# Patient Record
Sex: Female | Born: 1980 | Race: Black or African American | Hispanic: No | Marital: Single | Smoking: Former smoker
Health system: Southern US, Community
[De-identification: ages and names within clinical notes are randomized; demographics above are authoritative.]

## PROBLEM LIST (undated history)

## (undated) DIAGNOSIS — Z789 Other specified health status: Secondary | ICD-10-CM

## (undated) HISTORY — PX: WRIST FRACTURE SURGERY: SHX121

## (undated) HISTORY — PX: WRIST SURGERY: SHX841

---

## 2006-08-19 ENCOUNTER — Emergency Department (HOSPITAL_COMMUNITY): Admission: EM | Admit: 2006-08-19 | Discharge: 2006-08-19 | Payer: Self-pay | Admitting: Emergency Medicine

## 2006-11-22 ENCOUNTER — Emergency Department (HOSPITAL_COMMUNITY): Admission: EM | Admit: 2006-11-22 | Discharge: 2006-11-22 | Payer: Self-pay | Admitting: Emergency Medicine

## 2006-12-03 ENCOUNTER — Encounter: Admission: RE | Admit: 2006-12-03 | Discharge: 2006-12-03 | Payer: Self-pay | Admitting: Orthopedic Surgery

## 2006-12-11 ENCOUNTER — Ambulatory Visit (HOSPITAL_COMMUNITY): Admission: RE | Admit: 2006-12-11 | Discharge: 2006-12-11 | Payer: Self-pay | Admitting: Orthopedic Surgery

## 2007-01-21 ENCOUNTER — Encounter: Admission: RE | Admit: 2007-01-21 | Discharge: 2007-04-21 | Payer: Self-pay | Admitting: Orthopedic Surgery

## 2008-03-05 ENCOUNTER — Emergency Department (HOSPITAL_COMMUNITY): Admission: EM | Admit: 2008-03-05 | Discharge: 2008-03-05 | Payer: Self-pay | Admitting: Family Medicine

## 2008-06-26 IMAGING — CT CT EXTREM UP W/O CM*R*
3 series · 13 of 20 positions shown, 14 images · IV contrast (agent unspecified)
Comparison: Plain film examination 11/22/06.  No comparison CT.

CLINICAL DATA: Distal radial and ulnar fracture. 
CT OF THE RIGHT WRIST WITHOUT CONTRAST:
TECHNIQUE: Multidetector CT imaging was performed according to the standard protocol.  Multiplanar CT image reconstructions were also generated.

[Series 3: wrist/standard · axial · 0.33mm/px · z∈[-12,+39]mm · 8 of 105 slices shown]
[im 12/105  soft-tissue]
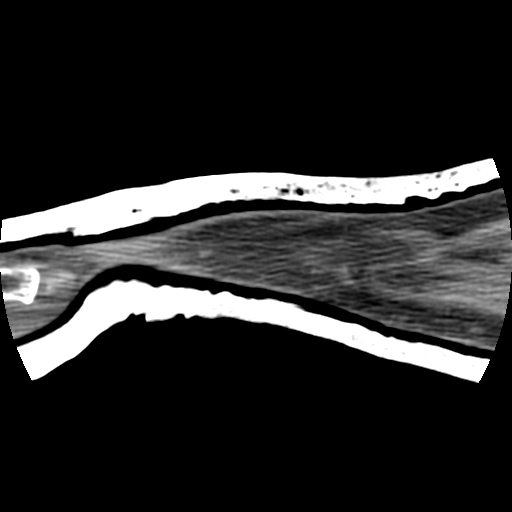
[im 24/105  soft-tissue]
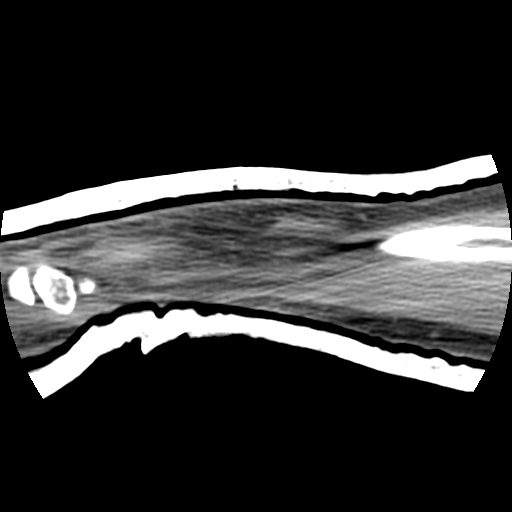
[im 35/105  soft-tissue]
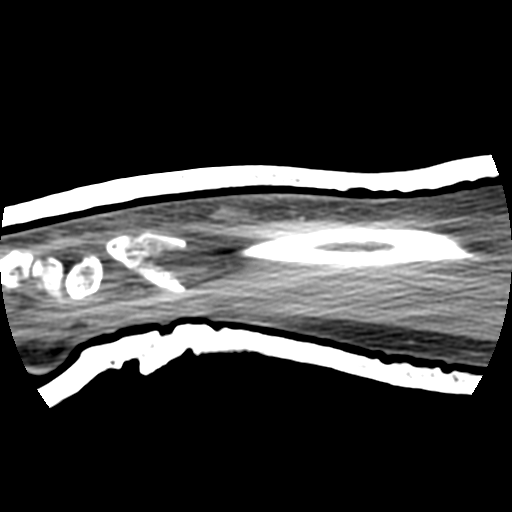
[im 47/105  soft-tissue]
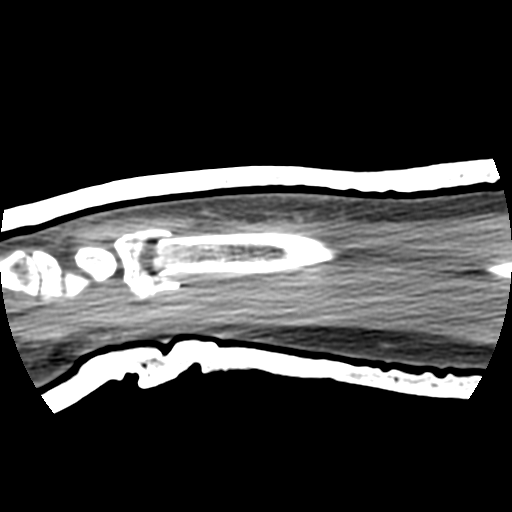
[im 58/105  soft-tissue]
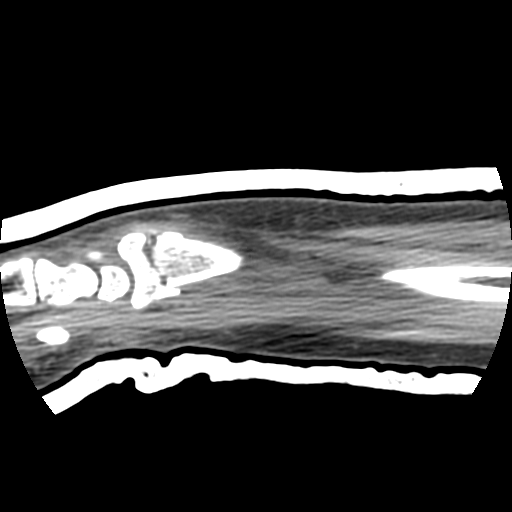
[im 70/105  soft-tissue]
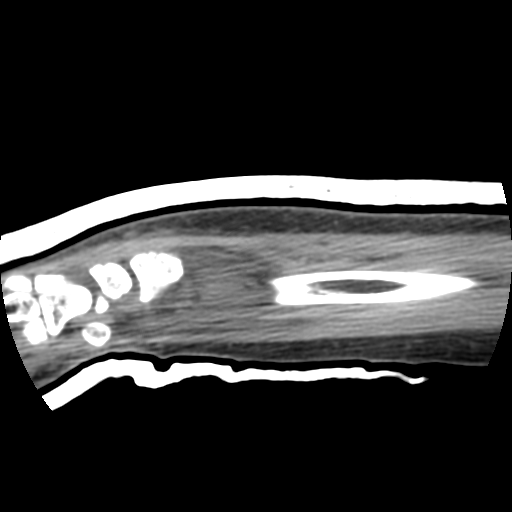
[im 81/105  soft-tissue]
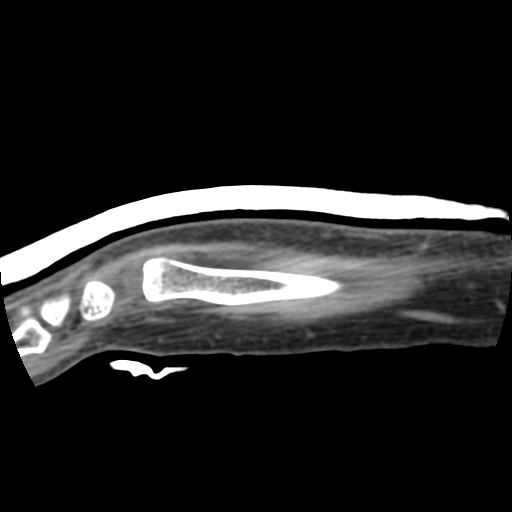
[im 93/105  soft-tissue]
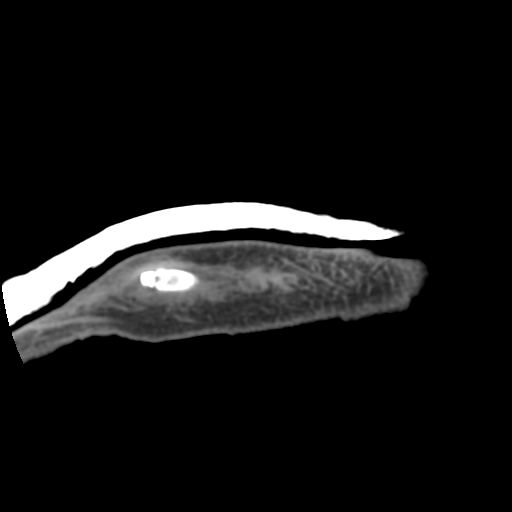

[Series 401: axial ref · sagittal · 0.33mm/px · 2 of 40 slices shown, 3 images]
[im 14/40  soft-tissue]
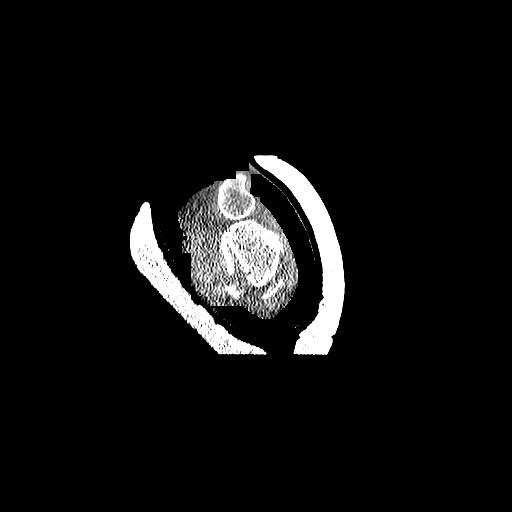
[im 14/40  bone]
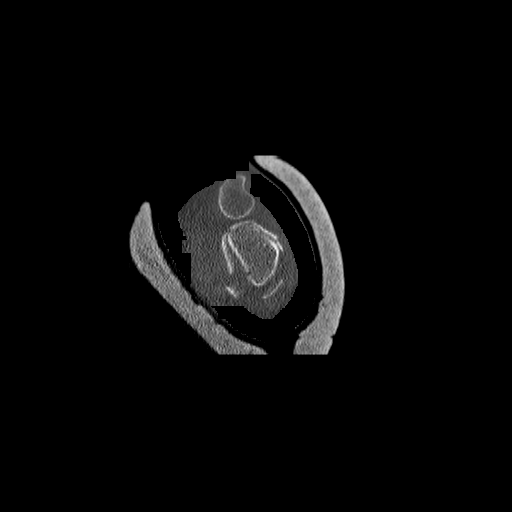
[im 27/40  bone]
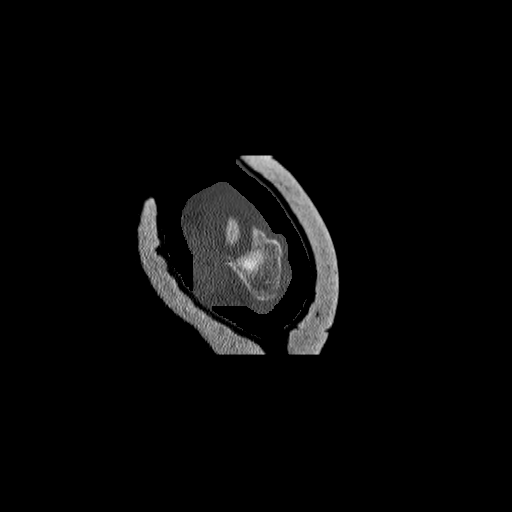

[Series 402: coronal ref · coronal · 0.33mm/px · 3 of 40 slices shown]
[im 8/40  bone]
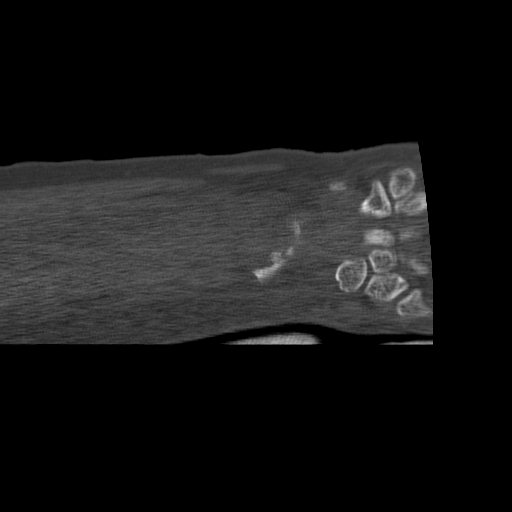
[im 16/40  bone]
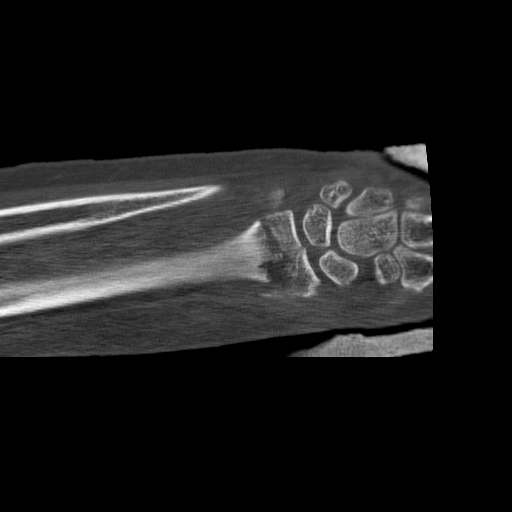
[im 24/40  bone]
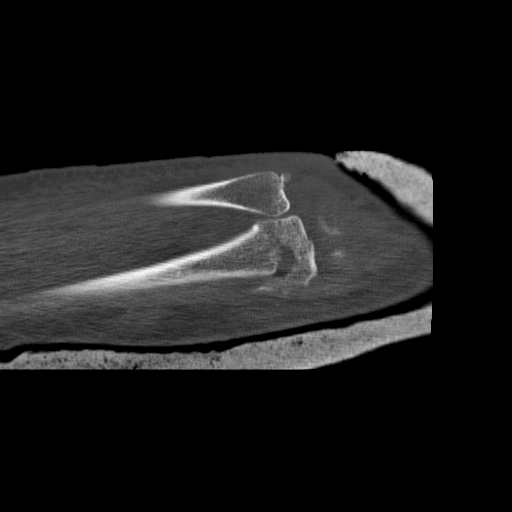

[13 of 20 positions shown; findings below may reference images not displayed]

FINDINGS: The present exam was performed with the patient in a plaster splint.  The distal [DATE] of the radius and ulna are included on the present exam.  There is a comminuted fracture of the distal radius with intraarticular extension.  Mild incongruity of the articular surface by 1 to 2 mm.  The comminuted distal radial fracture fragments are slightly separated and foreshortened overlying the distal radial shaft fracture aspect.  Ulnar styloid nondisplaced fracture.  Question tiny fracture of the palmar aspect of the trapezium (series 2 image 80).
IMPRESSION: 1.  Comminuted distal right radius fracture with intraarticular extension and alignment as described above.  Nondisplaced ulnar styloid fracture. 
2.  Question subtle fracture of the palmar aspect of the trapezium?

## 2008-07-04 IMAGING — CR DG WRIST 2V*R*
2 series · 2 of 2 positions shown · non-contrast
Comparison: 12/11/06.

CLINICAL DATA: Wrist ORIF. 
 PORTABLE RIGHT WRIST ? 2 VIEW:

[view not recorded (1 of 2)]
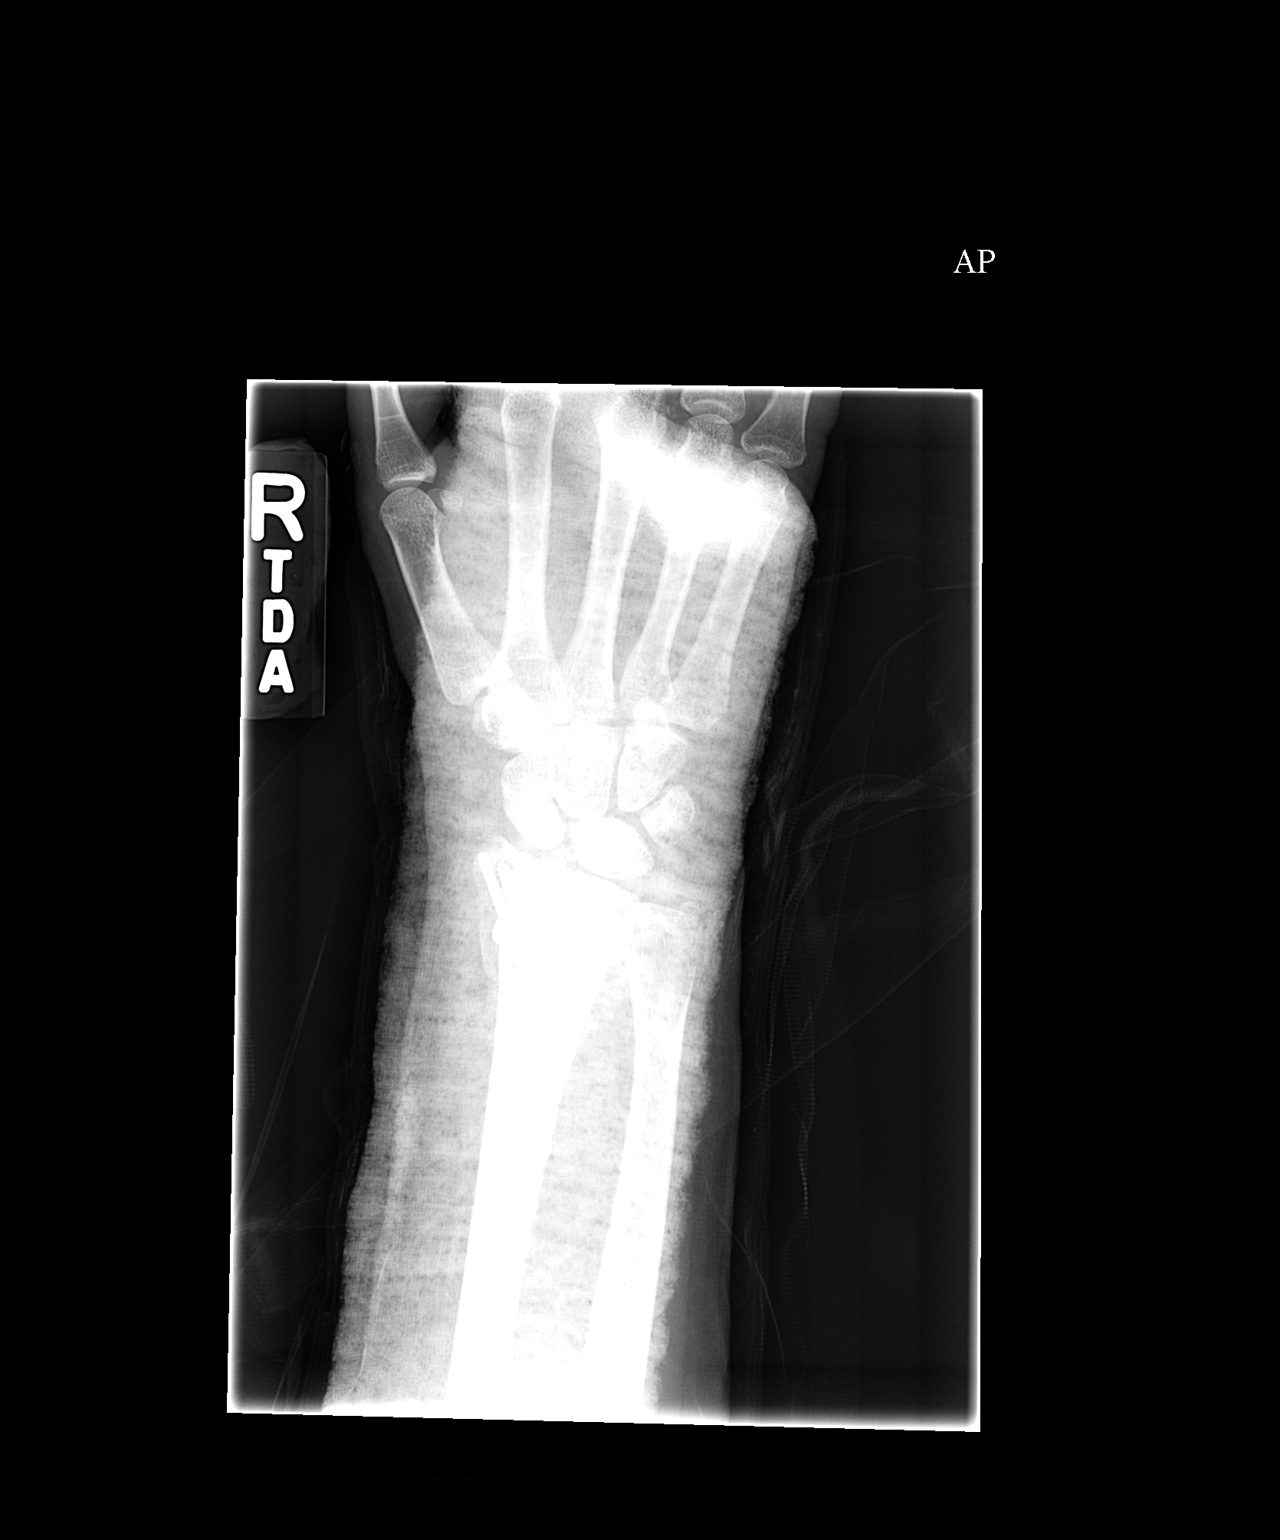

[view not recorded (2 of 2)]
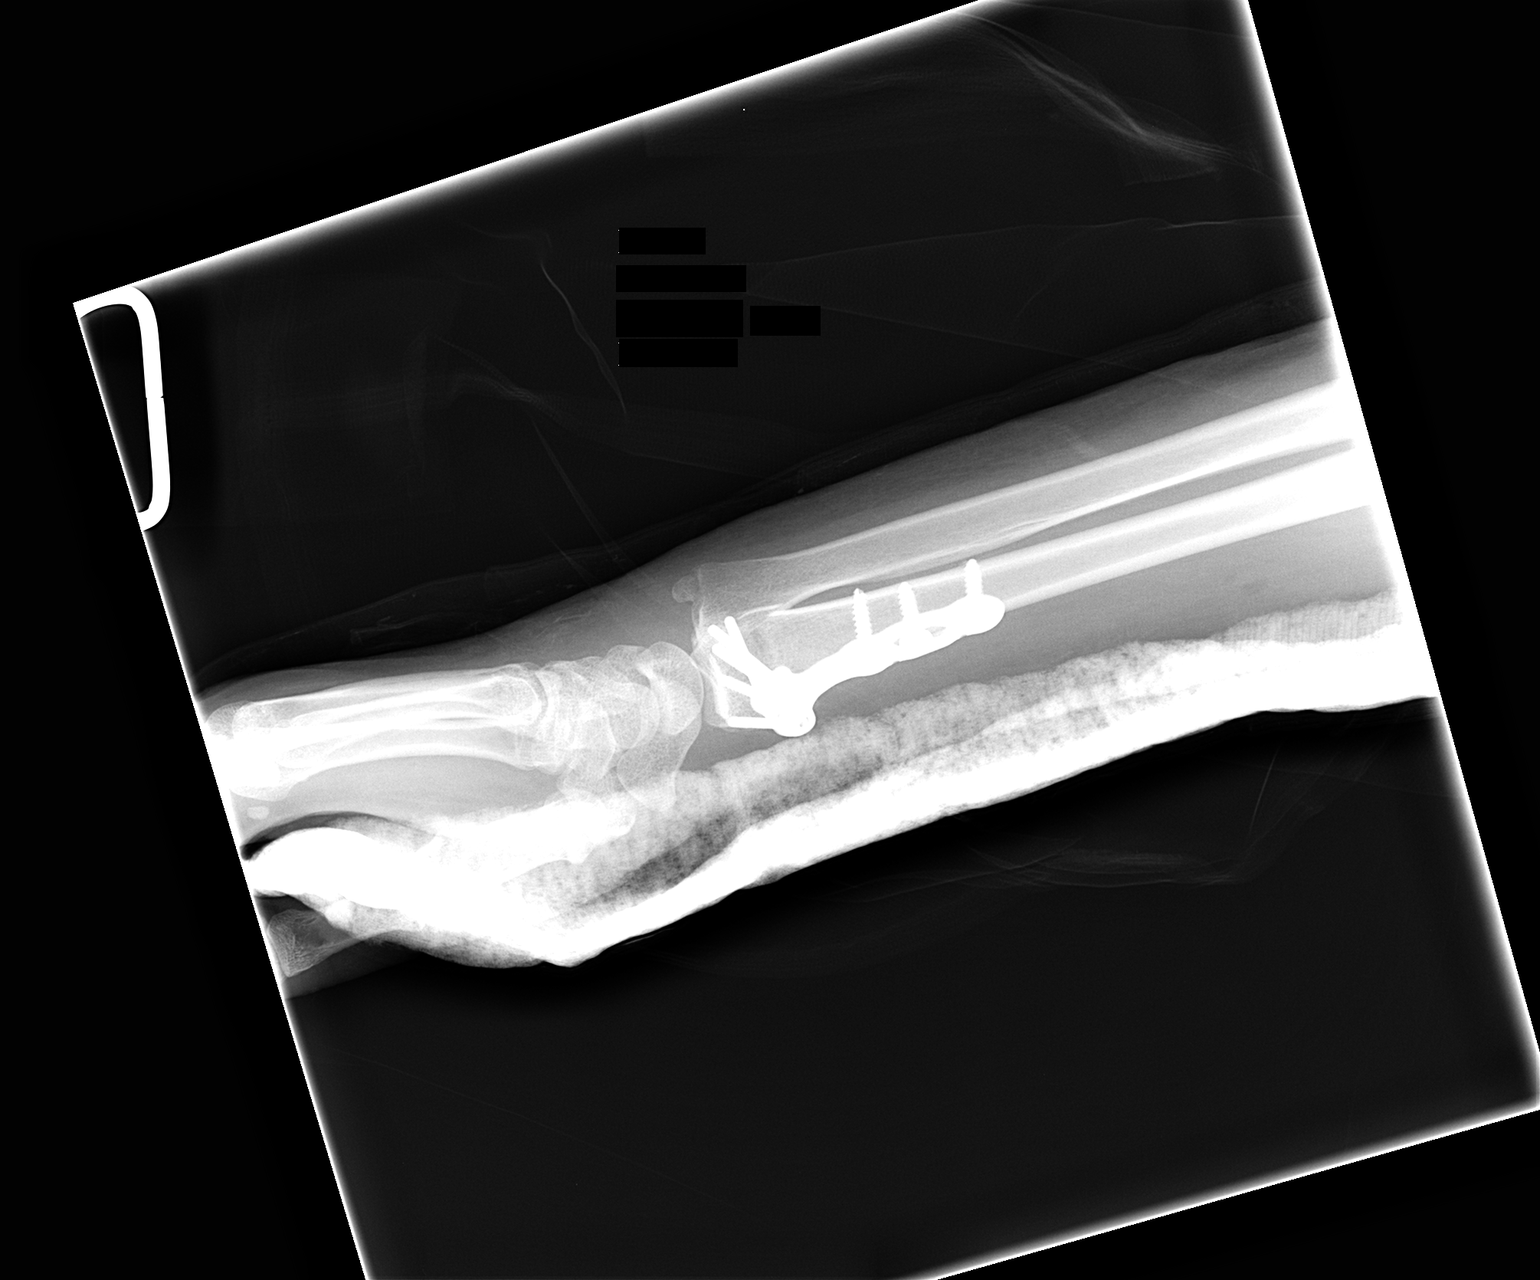

[2 of 2 positions shown; findings below may reference images not displayed]

FINDINGS: A plaster splint is in place.  There are plate and screws across the fracture of the distal radius.  Fracture is comminuted and mildly displaced but now in much better alignment than the original study.  There is a fracture of the ulnar styloid which has not been surgically repaired.
IMPRESSION: Volar plate and screw fusion of comminuted fracture of the distal radius now with satisfactory alignment.

## 2008-07-30 ENCOUNTER — Inpatient Hospital Stay (HOSPITAL_COMMUNITY): Admission: AD | Admit: 2008-07-30 | Discharge: 2008-07-30 | Payer: Self-pay | Admitting: Obstetrics

## 2008-09-03 ENCOUNTER — Inpatient Hospital Stay (HOSPITAL_COMMUNITY): Admission: AD | Admit: 2008-09-03 | Discharge: 2008-09-05 | Payer: Self-pay | Admitting: Obstetrics

## 2009-12-16 ENCOUNTER — Emergency Department (HOSPITAL_COMMUNITY): Admission: EM | Admit: 2009-12-16 | Discharge: 2009-12-16 | Payer: Self-pay | Admitting: Emergency Medicine

## 2010-09-02 LAB — URINALYSIS, ROUTINE W REFLEX MICROSCOPIC
Hgb urine dipstick: NEGATIVE
Nitrite: NEGATIVE
pH: 7 (ref 5.0–8.0)

## 2010-09-02 LAB — POCT PREGNANCY, URINE: Preg Test, Ur: NEGATIVE

## 2010-09-27 LAB — CBC
HCT: 33.8 % — ABNORMAL LOW (ref 36.0–46.0)
HCT: 37.1 % (ref 36.0–46.0)
Hemoglobin: 12.9 g/dL (ref 12.0–15.0)
MCHC: 34.4 g/dL (ref 30.0–36.0)
Platelets: 177 10*3/uL (ref 150–400)
Platelets: 185 10*3/uL (ref 150–400)
RBC: 3.38 MIL/uL — ABNORMAL LOW (ref 3.87–5.11)
RDW: 14.1 % (ref 11.5–15.5)
WBC: 13.7 10*3/uL — ABNORMAL HIGH (ref 4.0–10.5)

## 2010-09-27 LAB — STREP B DNA PROBE: Strep Group B Ag: NEGATIVE

## 2010-09-27 LAB — RPR: RPR Ser Ql: NONREACTIVE

## 2010-09-27 LAB — CCBB MATERNAL DONOR DRAW
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
## 2011-04-03 LAB — CBC
HCT: 43.6
MCHC: 34.2
Platelets: 329
RDW: 13

## 2011-10-19 ENCOUNTER — Encounter (HOSPITAL_COMMUNITY): Payer: Self-pay | Admitting: *Deleted

## 2011-10-19 ENCOUNTER — Emergency Department (HOSPITAL_COMMUNITY)
Admission: EM | Admit: 2011-10-19 | Discharge: 2011-10-19 | Disposition: A | Discharge status: Home / Self Care | Payer: 59 | Source: Home / Self Care

## 2011-10-19 DIAGNOSIS — J069 Acute upper respiratory infection, unspecified: Secondary | ICD-10-CM

## 2011-10-19 MED ORDER — FLUTICASONE PROPIONATE 50 MCG/ACT NA SUSP: 2.0000 | Freq: Every day | NASAL | Status: DC

## 2011-10-19 MED ORDER — CETIRIZINE HCL 10 MG PO TABS: 10.0000 mg | ORAL_TABLET | Freq: Every day | ORAL | Status: DC

## 2011-10-19 NOTE — ED Notes (Signed)
Pt with c/o sore throat/cough/congestion onset x 3 days - taking over the counter medications without relief

## 2011-10-19 NOTE — ED Provider Notes (Signed)
Jeanette Pham is a 31 y.o. female who presents to Urgent Care today for sore throat for the last 3 days. Associated with lymph node swelling bilaterally hoarseness cough itchy eyes, and nasal congestion. No rhinorrhea shortness of breath fevers or chills. Jeanette Pham has a similar illness.  Has tried Mucinex DayQuil and NyQuil with only worked a little. Inability to drink normally   PMH reviewed. Otherwise healthy young woman ROS as above otherwise neg.  no chest pains, palpitations, fevers, chills, abdominal pain nausea or vomiting. Medications reviewed. No current facility-administered medications for this encounter.   Current Outpatient Prescriptions  Medication Sig Dispense Refill  . guaiFENesin (MUCINEX) 600 MG 12 hr tablet Take 1,200 mg by mouth 2 (two) times daily.      . Pseudoeph-Doxylamine-DM-APAP (NYQUIL PO) Take by mouth.      . cetirizine (ZYRTEC ALLERGY) 10 MG tablet Take 1 tablet (10 mg total) by mouth daily.  30 tablet  6  . fluticasone (FLONASE) 50 MCG/ACT nasal spray Place 2 sprays into the nose daily.  16 g  2    Exam:  BP 120/75  Pulse 98  Temp(Src) 98.3 F (36.8 C) (Oral)  Resp 16  SpO2 96%  LMP 10/05/2011 Gen: Well NAD HEENT: EOMI,  MMM, tympanic membranes normal bilaterally. Posterior pharynx is erythematous without exudate. Submandibular lymphadenopathy bilaterally Lungs: CTABL Nl WOB Heart: RRR no MRG Abd: NABS, NT, ND Exts: Non edematous BL  LE, warm and well perfused.   Results for orders placed during the hospital encounter of 10/19/11 (from the past 24 hour(s))  POCT RAPID STREP A ([facility] ONLY)     Status: Normal   Collection Time   10/19/11  5:04 PM      Component Value Range   Streptococcus, Group A Screen (Direct) NEGATIVE  NEGATIVE    No results found.  Assessment and Plan: 31 y.o. female with sore throat likely secondary to viral URI plus/minus seasonal allergies. Plan to treat symptomatically with Tylenol, Flonase nasal spray, and Zyrtec.  Recommend followup in one to 2 weeks if not improving. Discussed warning signs symptoms for worsening. Please see discharge instructions. Patient expresses understanding.     Rodolph Bong, MD 10/19/11 1728

## 2011-10-19 NOTE — Discharge Instructions (Signed)
Thank you for coming in today. You either have a cold or allergies or both. Take Tylenol scheduled over the next few days. Be careful not to take too much Tylenol as cold medicine like NyQuil also has Tylenol in it. Use Flonase nasal spray and Zyrtec allergy pills to help with the posterior nasal drip. You should start feeling better in a few days. If you don't get better in one or 2 weeks please come back. Call or go to the emergency room if you get worse, have trouble breathing, have chest pains, or palpitations.

## 2011-10-19 NOTE — ED Provider Notes (Signed)
Medical screening examination/treatment/procedure(s) were performed by non-physician practitioner and as supervising physician I was immediately available for consultation/collaboration.  Raynald Blend, MD 10/19/11 1843

## 2014-08-24 LAB — OB RESULTS CONSOLE GBS: GBS: NEGATIVE

## 2015-02-22 LAB — OB RESULTS CONSOLE RUBELLA ANTIBODY, IGM: RUBELLA: IMMUNE

## 2015-02-22 LAB — OB RESULTS CONSOLE ABO/RH: RH TYPE: POSITIVE

## 2015-02-22 LAB — OB RESULTS CONSOLE RPR: RPR: NONREACTIVE

## 2015-02-22 LAB — OB RESULTS CONSOLE HIV ANTIBODY (ROUTINE TESTING): HIV: NONREACTIVE

## 2015-02-22 LAB — OB RESULTS CONSOLE ANTIBODY SCREEN: ANTIBODY SCREEN: NEGATIVE

## 2015-02-22 LAB — OB RESULTS CONSOLE HEPATITIS B SURFACE ANTIGEN: HEP B S AG: NEGATIVE

## 2015-02-23 LAB — OB RESULTS CONSOLE GC/CHLAMYDIA
Chlamydia: NEGATIVE
GC PROBE AMP, GENITAL: NEGATIVE

## 2015-08-24 LAB — OB RESULTS CONSOLE GBS: STREP GROUP B AG: NEGATIVE

## 2015-09-17 ENCOUNTER — Encounter (HOSPITAL_COMMUNITY): Payer: Self-pay | Admitting: *Deleted

## 2015-09-17 ENCOUNTER — Inpatient Hospital Stay (HOSPITAL_COMMUNITY)
Admission: AD | Admit: 2015-09-17 | Discharge: 2015-09-20 | DRG: 765 | Disposition: A | Discharge status: Home / Self Care | Payer: 59 | Source: Ambulatory Visit | Attending: Obstetrics and Gynecology | Admitting: Obstetrics and Gynecology

## 2015-09-17 ENCOUNTER — Inpatient Hospital Stay (HOSPITAL_COMMUNITY): Payer: 59 | Admitting: Anesthesiology

## 2015-09-17 ENCOUNTER — Inpatient Hospital Stay (HOSPITAL_COMMUNITY)
Admission: AD | Admit: 2015-09-17 | Discharge: 2015-09-17 | Disposition: A | Discharge status: Home / Self Care | Payer: 59 | Source: Ambulatory Visit | Attending: Obstetrics and Gynecology | Admitting: Obstetrics and Gynecology

## 2015-09-17 DIAGNOSIS — Z3483 Encounter for supervision of other normal pregnancy, third trimester: Secondary | ICD-10-CM | POA: Diagnosis present

## 2015-09-17 DIAGNOSIS — D62 Acute posthemorrhagic anemia: Secondary | ICD-10-CM | POA: Diagnosis not present

## 2015-09-17 DIAGNOSIS — O322XX0 Maternal care for transverse and oblique lie, not applicable or unspecified: Secondary | ICD-10-CM | POA: Diagnosis present

## 2015-09-17 DIAGNOSIS — Z8249 Family history of ischemic heart disease and other diseases of the circulatory system: Secondary | ICD-10-CM

## 2015-09-17 DIAGNOSIS — Z833 Family history of diabetes mellitus: Secondary | ICD-10-CM

## 2015-09-17 DIAGNOSIS — Z3A39 39 weeks gestation of pregnancy: Secondary | ICD-10-CM | POA: Diagnosis not present

## 2015-09-17 DIAGNOSIS — Z87891 Personal history of nicotine dependence: Secondary | ICD-10-CM

## 2015-09-17 DIAGNOSIS — O9081 Anemia of the puerperium: Secondary | ICD-10-CM | POA: Diagnosis not present

## 2015-09-17 DIAGNOSIS — IMO0001 Reserved for inherently not codable concepts without codable children: Secondary | ICD-10-CM

## 2015-09-17 HISTORY — DX: Other specified health status: Z78.9

## 2015-09-17 LAB — URINE MICROSCOPIC-ADD ON

## 2015-09-17 LAB — CBC
HCT: 33.6 % — ABNORMAL LOW (ref 36.0–46.0)
HEMOGLOBIN: 11.7 g/dL — AB (ref 12.0–15.0)
MCH: 31 pg (ref 26.0–34.0)
MCHC: 34.8 g/dL (ref 30.0–36.0)
MCV: 89.1 fL (ref 78.0–100.0)
PLATELETS: 256 10*3/uL (ref 150–400)
RBC: 3.77 MIL/uL — AB (ref 3.87–5.11)
RDW: 14.8 % (ref 11.5–15.5)
WBC: 13.7 10*3/uL — AB (ref 4.0–10.5)

## 2015-09-17 LAB — URINALYSIS, ROUTINE W REFLEX MICROSCOPIC
Bilirubin Urine: NEGATIVE
GLUCOSE, UA: NEGATIVE mg/dL
KETONES UR: NEGATIVE mg/dL
Nitrite: NEGATIVE
PH: 6.5 (ref 5.0–8.0)
Protein, ur: NEGATIVE mg/dL
Specific Gravity, Urine: 1.015 (ref 1.005–1.030)

## 2015-09-17 LAB — TYPE AND SCREEN
ABO/RH(D): O POS
Antibody Screen: NEGATIVE

## 2015-09-17 MED ORDER — OXYTOCIN BOLUS FROM INFUSION: 500.0000 mL | INTRAVENOUS | Status: DC

## 2015-09-17 MED ORDER — CITRIC ACID-SODIUM CITRATE 334-500 MG/5ML PO SOLN
30.0000 mL | ORAL | Status: DC | PRN
Start: 2015-09-17 — End: 2015-09-18
  Administered 2015-09-18: 30 mL via ORAL
  Filled 2015-09-17: qty 15

## 2015-09-17 MED ORDER — EPHEDRINE 5 MG/ML INJ: 10.0000 mg | INTRAVENOUS | Status: DC | PRN

## 2015-09-17 MED ORDER — OXYTOCIN 10 UNIT/ML IJ SOLN
2.5000 [IU]/h | INTRAMUSCULAR | Status: DC
  Filled 2015-09-17: qty 4

## 2015-09-17 MED ORDER — OXYCODONE-ACETAMINOPHEN 5-325 MG PO TABS
1.0000 | ORAL_TABLET | ORAL | Status: DC | PRN
Start: 2015-09-17 — End: 2015-09-18

## 2015-09-17 MED ORDER — ONDANSETRON HCL 4 MG/2ML IJ SOLN: 4.0000 mg | Freq: Four times a day (QID) | INTRAMUSCULAR | Status: DC | PRN

## 2015-09-17 MED ORDER — DIPHENHYDRAMINE HCL 50 MG/ML IJ SOLN: 12.5000 mg | INTRAMUSCULAR | Status: DC | PRN

## 2015-09-17 MED ORDER — LIDOCAINE HCL (PF) 1 % IJ SOLN
INTRAMUSCULAR | Status: DC | PRN
  Administered 2015-09-17 (×2): 5 mL

## 2015-09-17 MED ORDER — PHENYLEPHRINE 40 MCG/ML (10ML) SYRINGE FOR IV PUSH (FOR BLOOD PRESSURE SUPPORT)
80.0000 ug | PREFILLED_SYRINGE | INTRAVENOUS | Status: AC | PRN
  Administered 2015-09-18: 80 ug via INTRAVENOUS
  Administered 2015-09-18 (×2): 40 ug via INTRAVENOUS
  Administered 2015-09-18: 80 ug via INTRAVENOUS
  Administered 2015-09-18: 40 ug via INTRAVENOUS
  Administered 2015-09-18 (×2): 80 ug via INTRAVENOUS
  Administered 2015-09-18: 40 ug via INTRAVENOUS
  Administered 2015-09-18: 80 ug via INTRAVENOUS
  Administered 2015-09-18: 40 ug via INTRAVENOUS
  Filled 2015-09-17: qty 20

## 2015-09-17 MED ORDER — LACTATED RINGERS IV SOLN: 500.0000 mL | INTRAVENOUS | Status: DC | PRN

## 2015-09-17 MED ORDER — OXYCODONE-ACETAMINOPHEN 5-325 MG PO TABS: 2.0000 | ORAL_TABLET | ORAL | Status: DC | PRN

## 2015-09-17 MED ORDER — PHENYLEPHRINE 40 MCG/ML (10ML) SYRINGE FOR IV PUSH (FOR BLOOD PRESSURE SUPPORT)
80.0000 ug | PREFILLED_SYRINGE | INTRAVENOUS | Status: DC | PRN
Start: 2015-09-17 — End: 2015-09-18

## 2015-09-17 MED ORDER — LIDOCAINE HCL (PF) 1 % IJ SOLN
30.0000 mL | INTRAMUSCULAR | Status: DC | PRN
  Filled 2015-09-17: qty 30

## 2015-09-17 MED ORDER — FENTANYL 2.5 MCG/ML BUPIVACAINE 1/10 % EPIDURAL INFUSION (WH - ANES)
14.0000 mL/h | INTRAMUSCULAR | Status: DC | PRN
  Administered 2015-09-17 – 2015-09-18 (×2): 14 mL/h via EPIDURAL
  Filled 2015-09-17 (×2): qty 125

## 2015-09-17 MED ORDER — LACTATED RINGERS IV SOLN
500.0000 mL | Freq: Once | INTRAVENOUS | Status: AC
  Administered 2015-09-17: 500 mL via INTRAVENOUS

## 2015-09-17 MED ORDER — FLEET ENEMA 7-19 GM/118ML RE ENEM: 1.0000 | ENEMA | RECTAL | Status: DC | PRN

## 2015-09-17 MED ORDER — LACTATED RINGERS IV SOLN
INTRAVENOUS | Status: DC
  Administered 2015-09-17 – 2015-09-18 (×5): via INTRAVENOUS

## 2015-09-17 MED ORDER — ACETAMINOPHEN 325 MG PO TABS: 650.0000 mg | ORAL_TABLET | ORAL | Status: DC | PRN

## 2015-09-17 NOTE — Progress Notes (Signed)
S: comfortable  O: Epidural VE: 6/90/-3 LOA, asynclitic Bulging membrane AROM clear to A. ISE placed Tracing: baseline 145 (+)  155 Ctx q 2-3 mins  IMP: Active phase  Term gestation P) pitocin. IUPC placement. Right exaggerated sims position

## 2015-09-17 NOTE — Discharge Instructions (Signed)

## 2015-09-17 NOTE — Anesthesia Preprocedure Evaluation (Signed)

## 2015-09-17 NOTE — MAU Note (Signed)
C/o ucs since 0800 this AM; denies SROM; has had pinkish spotting and some cramping

## 2015-09-17 NOTE — Anesthesia Procedure Notes (Signed)
Epidural Patient location during procedure: OB  Staffing Anesthesiologist: Torie Priebe Performed by: anesthesiologist   Preanesthetic Checklist Completed: patient identified, site marked, surgical consent, pre-op evaluation, timeout performed, IV checked, risks and benefits discussed and monitors and equipment checked  Epidural Patient position: sitting Prep: DuraPrep Patient monitoring: heart rate, continuous pulse ox and blood pressure Approach: right paramedian Location: L3-L4 Injection technique: LOR saline  Needle:  Needle type: Tuohy  Needle gauge: 17 G Needle length: 9 cm and 9 Needle insertion depth: 6 cm Catheter type: closed end flexible Catheter size: 20 Guage Catheter at skin depth: 10 cm Test dose: negative  Assessment Events: blood not aspirated, injection not painful, no injection resistance, negative IV test and no paresthesia  Additional Notes Patient identified. Risks/Benefits/Options discussed with patient including but not limited to bleeding, infection, nerve damage, paralysis, failed block, incomplete pain control, headache, blood pressure changes, nausea, vomiting, reactions to medication both or allergic, itching and postpartum back pain. Confirmed with bedside nurse the patient's most recent platelet count. Confirmed with patient that they are not currently taking any anticoagulation, have any bleeding history or any family history of bleeding disorders. Patient expressed understanding and wished to proceed. All questions were answered. Sterile technique was used throughout the entire procedure. Please see nursing notes for vital signs. Test dose was given through epidural needle and negative prior to continuing to dose epidural or start infusion. Warning signs of high block given to the patient including shortness of breath, tingling/numbness in hands, complete motor block, or any concerning symptoms with instructions to call for help. Patient was given  instructions on fall risk and not to get out of bed. All questions and concerns addressed with instructions to call with any issues.   

## 2015-09-17 NOTE — H&P (Signed)
Jeanette Pham is a 35 y.o. female @ 39 2/[redacted] weeks gestation presenting in active labor (+) FM intact membrane Maternal Medical History:  Reason for admission: Contractions.   Fetal activity: Perceived fetal activity is normal.    Prenatal complications: no prenatal complications Prenatal Complications - Diabetes: none.    OB History    Gravida Para Term Preterm AB TAB SAB Ectopic Multiple Living   2 1  1      1      Past Medical History  Diagnosis Date  . Medical history non-contributory    Past Surgical History  Procedure Laterality Date  . Wrist surgery    . Wrist fracture surgery     Family History: family history includes Diabetes in her maternal grandmother; Hypertension in her mother. Social History:  reports that she quit smoking about a year ago. She does not have any smokeless tobacco history on file. She reports that she drinks alcohol. She reports that she does not use illicit drugs.   Prenatal Transfer Tool  Maternal Diabetes: No Genetic Screening: Normal Maternal Ultrasounds/Referrals: Normal Fetal Ultrasounds or other Referrals:  None Maternal Substance Abuse:  No Significant Maternal Medications:  Meds include: Other: 17OHP Significant Maternal Lab Results:  Lab values include: Group B Strep negative Other Comments:  hx PTB   Review of Systems  All other systems reviewed and are negative.   Dilation: 5.5 Effacement (%): 100 Exam by:: Elie ConferK. Weiss RN  Exam Physical Exam  Constitutional: She is oriented to person, place, and time. She appears well-developed and well-nourished.  Eyes: EOM are normal.  Neck: Neck supple.  Cardiovascular: Regular rhythm.   Respiratory: Breath sounds normal.  GI: Soft.  Musculoskeletal: She exhibits no edema.  Neurological: She is alert and oriented to person, place, and time.  Skin: Skin is warm and dry.  Psychiatric: She has a normal mood and affect.    Prenatal labs: ABO, Rh: O/Positive/-- (09/07  0000) Antibody: Negative (09/07 0000) Rubella: Immune (09/07 0000) RPR: Nonreactive (09/07 0000)  HBsAg: Negative (09/07 0000)  HIV: Non-reactive (09/07 0000)  GBS: Negative (03/09 0000)   Assessment/Plan:  Active labor Term gestation P) admit routine labs. Epidural. Amniotomy. Low pitocin prn  [name] A 09/17/2015, 8:18 PM

## 2015-09-17 NOTE — Progress Notes (Signed)
No cervical change Pt comfortable. Category 1 tracing with irregular contractions DC home with labor precautions.

## 2015-09-18 ENCOUNTER — Encounter (HOSPITAL_COMMUNITY)
Admission: AD | Disposition: A | Discharge status: Home / Self Care | Payer: Self-pay | Source: Ambulatory Visit | Attending: Obstetrics and Gynecology

## 2015-09-18 ENCOUNTER — Encounter (HOSPITAL_COMMUNITY): Payer: Self-pay | Admitting: Certified Registered Nurse Anesthetist

## 2015-09-18 LAB — RPR: RPR: NONREACTIVE

## 2015-09-18 LAB — ABO/RH: ABO/RH(D): O POS

## 2015-09-18 SURGERY — Surgical Case
Anesthesia: Epidural

## 2015-09-18 MED ORDER — SODIUM CHLORIDE 0.9% FLUSH: 3.0000 mL | Freq: Two times a day (BID) | INTRAVENOUS | Status: DC

## 2015-09-18 MED ORDER — SODIUM CHLORIDE 0.9% FLUSH: 3.0000 mL | INTRAVENOUS | Status: DC | PRN

## 2015-09-18 MED ORDER — ZOLPIDEM TARTRATE 5 MG PO TABS: 5.0000 mg | ORAL_TABLET | Freq: Every evening | ORAL | Status: DC | PRN

## 2015-09-18 MED ORDER — OXYTOCIN 10 UNIT/ML IJ SOLN
40.0000 [IU] | INTRAVENOUS | Status: DC | PRN
  Administered 2015-09-18: 40 [IU] via INTRAVENOUS

## 2015-09-18 MED ORDER — CEFAZOLIN SODIUM-DEXTROSE 2-4 GM/100ML-% IV SOLN
INTRAVENOUS | Status: AC
  Filled 2015-09-18: qty 100

## 2015-09-18 MED ORDER — DEXAMETHASONE SODIUM PHOSPHATE 4 MG/ML IJ SOLN
INTRAMUSCULAR | Status: DC | PRN
  Administered 2015-09-18: 4 mg via INTRAVENOUS

## 2015-09-18 MED ORDER — MEPERIDINE HCL 25 MG/ML IJ SOLN
INTRAMUSCULAR | Status: AC
  Filled 2015-09-18: qty 1

## 2015-09-18 MED ORDER — ONDANSETRON HCL 4 MG/2ML IJ SOLN
INTRAMUSCULAR | Status: DC | PRN
  Administered 2015-09-18: 4 mg via INTRAVENOUS

## 2015-09-18 MED ORDER — METHYLERGONOVINE MALEATE 0.2 MG PO TABS: 0.2000 mg | ORAL_TABLET | ORAL | Status: DC | PRN

## 2015-09-18 MED ORDER — ONDANSETRON HCL 4 MG/2ML IJ SOLN
INTRAMUSCULAR | Status: AC
  Filled 2015-09-18: qty 2

## 2015-09-18 MED ORDER — NALBUPHINE HCL 10 MG/ML IJ SOLN: 5.0000 mg | INTRAMUSCULAR | Status: DC | PRN

## 2015-09-18 MED ORDER — OXYCODONE HCL 5 MG PO TABS
5.0000 mg | ORAL_TABLET | ORAL | Status: DC | PRN
  Filled 2015-09-18: qty 1

## 2015-09-18 MED ORDER — ONDANSETRON HCL 4 MG/2ML IJ SOLN: 4.0000 mg | Freq: Three times a day (TID) | INTRAMUSCULAR | Status: DC | PRN

## 2015-09-18 MED ORDER — PHENYLEPHRINE 40 MCG/ML (10ML) SYRINGE FOR IV PUSH (FOR BLOOD PRESSURE SUPPORT)
PREFILLED_SYRINGE | INTRAVENOUS | Status: AC
  Filled 2015-09-18: qty 10

## 2015-09-18 MED ORDER — PHENYLEPHRINE 40 MCG/ML (10ML) SYRINGE FOR IV PUSH (FOR BLOOD PRESSURE SUPPORT)
PREFILLED_SYRINGE | INTRAVENOUS | Status: AC
  Filled 2015-09-18: qty 20

## 2015-09-18 MED ORDER — LACTATED RINGERS IV SOLN
INTRAVENOUS | Status: DC
  Administered 2015-09-18 (×2): via INTRAVENOUS

## 2015-09-18 MED ORDER — FENTANYL CITRATE (PF) 100 MCG/2ML IJ SOLN
INTRAMUSCULAR | Status: DC | PRN
  Administered 2015-09-18: 100 ug via EPIDURAL

## 2015-09-18 MED ORDER — MORPHINE SULFATE (PF) 0.5 MG/ML IJ SOLN
INTRAMUSCULAR | Status: AC
  Filled 2015-09-18: qty 10

## 2015-09-18 MED ORDER — BUPIVACAINE HCL (PF) 0.25 % IJ SOLN
INTRAMUSCULAR | Status: AC
  Filled 2015-09-18: qty 20

## 2015-09-18 MED ORDER — NALOXONE HCL 0.4 MG/ML IJ SOLN: 0.4000 mg | INTRAMUSCULAR | Status: DC | PRN

## 2015-09-18 MED ORDER — OXYCODONE HCL 5 MG PO TABS: 10.0000 mg | ORAL_TABLET | ORAL | Status: DC | PRN

## 2015-09-18 MED ORDER — DIPHENHYDRAMINE HCL 50 MG/ML IJ SOLN: 12.5000 mg | INTRAMUSCULAR | Status: DC | PRN

## 2015-09-18 MED ORDER — FENTANYL CITRATE (PF) 100 MCG/2ML IJ SOLN
INTRAMUSCULAR | Status: AC
  Filled 2015-09-18: qty 2

## 2015-09-18 MED ORDER — DIPHENHYDRAMINE HCL 25 MG PO CAPS
25.0000 mg | ORAL_CAPSULE | ORAL | Status: DC | PRN
  Filled 2015-09-18: qty 1

## 2015-09-18 MED ORDER — SODIUM CHLORIDE 0.9 % IV SOLN: 250.0000 mL | INTRAVENOUS | Status: DC

## 2015-09-18 MED ORDER — SIMETHICONE 80 MG PO CHEW
80.0000 mg | CHEWABLE_TABLET | Freq: Three times a day (TID) | ORAL | Status: DC
  Administered 2015-09-18 – 2015-09-20 (×4): 80 mg via ORAL
  Filled 2015-09-18 (×4): qty 1

## 2015-09-18 MED ORDER — SCOPOLAMINE 1 MG/3DAYS TD PT72: 1.0000 | MEDICATED_PATCH | Freq: Once | TRANSDERMAL | Status: DC

## 2015-09-18 MED ORDER — OXYTOCIN 10 UNIT/ML IJ SOLN
INTRAMUSCULAR | Status: AC
  Filled 2015-09-18: qty 4

## 2015-09-18 MED ORDER — MORPHINE SULFATE (PF) 0.5 MG/ML IJ SOLN
INTRAMUSCULAR | Status: DC | PRN
  Administered 2015-09-18: 2.5 mg via EPIDURAL

## 2015-09-18 MED ORDER — NALBUPHINE HCL 10 MG/ML IJ SOLN: 5.0000 mg | Freq: Once | INTRAMUSCULAR | Status: DC | PRN

## 2015-09-18 MED ORDER — LANOLIN HYDROUS EX OINT: 1.0000 "application " | TOPICAL_OINTMENT | CUTANEOUS | Status: DC | PRN

## 2015-09-18 MED ORDER — SCOPOLAMINE 1 MG/3DAYS TD PT72
MEDICATED_PATCH | TRANSDERMAL | Status: AC
  Filled 2015-09-18: qty 1

## 2015-09-18 MED ORDER — BISACODYL 10 MG RE SUPP: 10.0000 mg | Freq: Every day | RECTAL | Status: DC | PRN

## 2015-09-18 MED ORDER — METHYLERGONOVINE MALEATE 0.2 MG/ML IJ SOLN: 0.2000 mg | INTRAMUSCULAR | Status: DC | PRN

## 2015-09-18 MED ORDER — CEFAZOLIN SODIUM-DEXTROSE 2-3 GM-% IV SOLR
INTRAVENOUS | Status: DC | PRN
  Administered 2015-09-18: 2 g via INTRAVENOUS

## 2015-09-18 MED ORDER — SODIUM BICARBONATE 8.4 % IV SOLN
INTRAVENOUS | Status: AC
  Filled 2015-09-18: qty 50

## 2015-09-18 MED ORDER — LACTATED RINGERS IV SOLN
INTRAVENOUS | Status: DC | PRN
  Administered 2015-09-18: 08:00:00 via INTRAVENOUS

## 2015-09-18 MED ORDER — MEPERIDINE HCL 25 MG/ML IJ SOLN
INTRAMUSCULAR | Status: DC | PRN
  Administered 2015-09-18: 12.5 mg via INTRAVENOUS

## 2015-09-18 MED ORDER — OXYTOCIN 10 UNIT/ML IJ SOLN: 2.5000 [IU]/h | INTRAVENOUS | Status: AC

## 2015-09-18 MED ORDER — NALOXONE HCL 2 MG/2ML IJ SOSY
1.0000 ug/kg/h | PREFILLED_SYRINGE | INTRAMUSCULAR | Status: DC | PRN
  Filled 2015-09-18: qty 2

## 2015-09-18 MED ORDER — BUPIVACAINE HCL (PF) 0.25 % IJ SOLN
INTRAMUSCULAR | Status: AC
  Filled 2015-09-18: qty 10

## 2015-09-18 MED ORDER — TERBUTALINE SULFATE 1 MG/ML IJ SOLN: 0.2500 mg | Freq: Once | INTRAMUSCULAR | Status: AC

## 2015-09-18 MED ORDER — FLEET ENEMA 7-19 GM/118ML RE ENEM: 1.0000 | ENEMA | Freq: Every day | RECTAL | Status: DC | PRN

## 2015-09-18 MED ORDER — FERROUS SULFATE 325 (65 FE) MG PO TABS
325.0000 mg | ORAL_TABLET | Freq: Two times a day (BID) | ORAL | Status: DC
  Administered 2015-09-18 – 2015-09-20 (×4): 325 mg via ORAL
  Filled 2015-09-18 (×4): qty 1

## 2015-09-18 MED ORDER — MEPERIDINE HCL 25 MG/ML IJ SOLN
6.2500 mg | INTRAMUSCULAR | Status: DC | PRN
Start: 2015-09-18 — End: 2015-09-18

## 2015-09-18 MED ORDER — DIBUCAINE 1 % RE OINT: 1.0000 "application " | TOPICAL_OINTMENT | RECTAL | Status: DC | PRN

## 2015-09-18 MED ORDER — PRENATAL MULTIVITAMIN CH
1.0000 | ORAL_TABLET | Freq: Every day | ORAL | Status: DC
Start: 2015-09-18 — End: 2015-09-20
  Administered 2015-09-18 – 2015-09-19 (×2): 1 via ORAL
  Filled 2015-09-18 (×2): qty 1

## 2015-09-18 MED ORDER — KETOROLAC TROMETHAMINE 30 MG/ML IJ SOLN: 30.0000 mg | Freq: Four times a day (QID) | INTRAMUSCULAR | Status: AC | PRN

## 2015-09-18 MED ORDER — MENTHOL 3 MG MT LOZG: 1.0000 | LOZENGE | OROMUCOSAL | Status: DC | PRN

## 2015-09-18 MED ORDER — SIMETHICONE 80 MG PO CHEW: 80.0000 mg | CHEWABLE_TABLET | ORAL | Status: DC | PRN

## 2015-09-18 MED ORDER — SODIUM BICARBONATE 8.4 % IV SOLN
INTRAVENOUS | Status: DC | PRN
  Administered 2015-09-18 (×3): 5 mL via EPIDURAL

## 2015-09-18 MED ORDER — SIMETHICONE 80 MG PO CHEW
80.0000 mg | CHEWABLE_TABLET | ORAL | Status: DC
  Administered 2015-09-18 – 2015-09-19 (×2): 80 mg via ORAL
  Filled 2015-09-18 (×2): qty 1

## 2015-09-18 MED ORDER — IBUPROFEN 600 MG PO TABS
600.0000 mg | ORAL_TABLET | Freq: Four times a day (QID) | ORAL | Status: DC
  Administered 2015-09-18 – 2015-09-20 (×7): 600 mg via ORAL
  Filled 2015-09-18 (×7): qty 1

## 2015-09-18 MED ORDER — HYDROMORPHONE HCL 1 MG/ML IJ SOLN: 0.2500 mg | INTRAMUSCULAR | Status: DC | PRN

## 2015-09-18 MED ORDER — SENNOSIDES-DOCUSATE SODIUM 8.6-50 MG PO TABS
2.0000 | ORAL_TABLET | ORAL | Status: DC
Start: 2015-09-19 — End: 2015-09-20
  Administered 2015-09-18 – 2015-09-19 (×2): 2 via ORAL
  Filled 2015-09-18 (×2): qty 2

## 2015-09-18 MED ORDER — LIDOCAINE-EPINEPHRINE (PF) 2 %-1:200000 IJ SOLN
INTRAMUSCULAR | Status: AC
  Filled 2015-09-18: qty 20

## 2015-09-18 MED ORDER — KETOROLAC TROMETHAMINE 30 MG/ML IJ SOLN
INTRAMUSCULAR | Status: AC
  Filled 2015-09-18: qty 1

## 2015-09-18 MED ORDER — DIPHENHYDRAMINE HCL 25 MG PO CAPS: 25.0000 mg | ORAL_CAPSULE | Freq: Four times a day (QID) | ORAL | Status: DC | PRN

## 2015-09-18 MED ORDER — DEXAMETHASONE SODIUM PHOSPHATE 4 MG/ML IJ SOLN
INTRAMUSCULAR | Status: AC
  Filled 2015-09-18: qty 1

## 2015-09-18 MED ORDER — TERBUTALINE SULFATE 1 MG/ML IJ SOLN
INTRAMUSCULAR | Status: AC
  Administered 2015-09-18: 1 mg
  Filled 2015-09-18: qty 1

## 2015-09-18 MED ORDER — KETOROLAC TROMETHAMINE 30 MG/ML IJ SOLN
30.0000 mg | Freq: Four times a day (QID) | INTRAMUSCULAR | Status: AC | PRN
  Administered 2015-09-18: 30 mg via INTRAMUSCULAR

## 2015-09-18 MED ORDER — WITCH HAZEL-GLYCERIN EX PADS: 1.0000 "application " | MEDICATED_PAD | CUTANEOUS | Status: DC | PRN

## 2015-09-18 MED ORDER — SCOPOLAMINE 1 MG/3DAYS TD PT72
MEDICATED_PATCH | TRANSDERMAL | Status: DC | PRN
  Administered 2015-09-18: 1 via TRANSDERMAL

## 2015-09-18 SURGICAL SUPPLY — 42 items
BARRIER ADHS 3X4 INTERCEED (GAUZE/BANDAGES/DRESSINGS) ×2 IMPLANT
BENZOIN TINCTURE PRP APPL 2/3 (GAUZE/BANDAGES/DRESSINGS) IMPLANT
CHLORAPREP W/TINT 26ML (MISCELLANEOUS) ×2 IMPLANT
CLAMP CORD UMBIL (MISCELLANEOUS) IMPLANT
CLOTH BEACON ORANGE TIMEOUT ST (SAFETY) ×2 IMPLANT
CONTAINER PREFILL 10% NBF 15ML (MISCELLANEOUS) IMPLANT
DRAPE C SECTION CLR SCREEN (DRAPES) ×2 IMPLANT
DRSG OPSITE POSTOP 4X10 (GAUZE/BANDAGES/DRESSINGS) ×2 IMPLANT
ELECT REM PT RETURN 9FT ADLT (ELECTROSURGICAL) ×2
ELECTRODE REM PT RTRN 9FT ADLT (ELECTROSURGICAL) ×1 IMPLANT
EXTRACTOR VACUUM M CUP 4 TUBE (SUCTIONS) IMPLANT
GLOVE BIOGEL PI IND STRL 7.0 (GLOVE) ×3 IMPLANT
GLOVE BIOGEL PI INDICATOR 7.0 (GLOVE) ×3
GLOVE ECLIPSE 6.5 STRL STRAW (GLOVE) ×2 IMPLANT
GOWN STRL REUS W/TWL LRG LVL3 (GOWN DISPOSABLE) ×4 IMPLANT
KIT ABG SYR 3ML LUER SLIP (SYRINGE) IMPLANT
NEEDLE HYPO 22GX1.5 SAFETY (NEEDLE) ×2 IMPLANT
NEEDLE HYPO 25X5/8 SAFETYGLIDE (NEEDLE) IMPLANT
NS IRRIG 1000ML POUR BTL (IV SOLUTION) ×2 IMPLANT
PACK C SECTION WH (CUSTOM PROCEDURE TRAY) ×2 IMPLANT
PAD OB MATERNITY 4.3X12.25 (PERSONAL CARE ITEMS) ×2 IMPLANT
RTRCTR C-SECT PINK 25CM LRG (MISCELLANEOUS) ×2 IMPLANT
STAPLER VISISTAT 35W (STAPLE) ×2 IMPLANT
STRIP CLOSURE SKIN 1/2X4 (GAUZE/BANDAGES/DRESSINGS) IMPLANT
SUT CHROMIC GUT AB #0 18 (SUTURE) IMPLANT
SUT MNCRL 0 VIOLET CTX 36 (SUTURE) ×4 IMPLANT
SUT MON AB 2-0 SH 27 (SUTURE)
SUT MON AB 2-0 SH27 (SUTURE) IMPLANT
SUT MON AB 3-0 SH 27 (SUTURE)
SUT MON AB 3-0 SH27 (SUTURE) IMPLANT
SUT MON AB 4-0 PS1 27 (SUTURE) IMPLANT
SUT MONOCRYL 0 CTX 36 (SUTURE) ×4
SUT PLAIN 2 0 (SUTURE) ×1
SUT PLAIN 2 0 XLH (SUTURE) IMPLANT
SUT PLAIN ABS 2-0 CT1 27XMFL (SUTURE) ×1 IMPLANT
SUT VIC AB 0 CT1 36 (SUTURE) ×4 IMPLANT
SUT VIC AB 2-0 CT1 27 (SUTURE) ×1
SUT VIC AB 2-0 CT1 TAPERPNT 27 (SUTURE) ×1 IMPLANT
SUT VIC AB 4-0 PS2 27 (SUTURE) IMPLANT
SYR CONTROL 10ML LL (SYRINGE) ×2 IMPLANT
TOWEL OR 17X24 6PK STRL BLUE (TOWEL DISPOSABLE) ×2 IMPLANT
TRAY FOLEY CATH SILVER 14FR (SET/KITS/TRAYS/PACK) IMPLANT

## 2015-09-18 NOTE — Progress Notes (Signed)
S: called by RN: regarding repeat exam O:BP 131/60 mmHg  Pulse 87  Temp(Src) 98.3 F (36.8 C) (Oral)  Resp 16  Ht 5\' 2"  (1.575 m)  Wt 99.791 kg (220 lb)  BMI 40.23 kg/m2  SpO2 97% VE unchanged   IMP: Arrest of dilation Term gestation  P) primary C/S.  Risk of C/S reviewed including infection, bleeding, injury to surrounding organ structures,  Internal scar tissue, poss need for blood transfusion and its risk . All ? Answered. Consent signed

## 2015-09-18 NOTE — Brief Op Note (Signed)
09/17/2015 - 09/18/2015  8:35 AM  PATIENT:  Jeanette Pham  35 y.o. female  PRE-OPERATIVE DIAGNOSIS:  arrest of dilation, term gestation  POST-OPERATIVE DIAGNOSIS:  arrest of dilation, LOT arrest, term gestation  PROCEDURE:  Primary Cesarean section, kerr hysterotomy  SURGEON:  Surgeon(s) and Role:    * Maxie BetterSheronette Swara Donze, MD - Primary  PHYSICIAN ASSISTANT:   ASSISTANTS: none   ANESTHESIA:   epidural Findings: live female, LOT CAN x 1 reducible, nl tubes and ovaries, ant placenta.  EBL:  Total I/O In: 1600 [I.V.:1600] Out: 1000 [Urine:100; Blood:900]  BLOOD ADMINISTERED:none  DRAINS: none   LOCAL MEDICATIONS USED:  MARCAINE     SPECIMEN:  No Specimen  DISPOSITION OF SPECIMEN:  N/A  COUNTS:  YES  TOURNIQUET:  * No tourniquets in log *  DICTATION: .Other Dictation: Dictation Number [id number]  PLAN OF CARE: Admit to inpatient   PATIENT DISPOSITION:  PACU - hemodynamically stable.   Delay start of Pharmacological VTE agent (>24hrs) due to surgical blood loss or risk of bleeding: no

## 2015-09-18 NOTE — Transfer of Care (Signed)
Immediate Anesthesia Transfer of Care Note  Patient: Jeanette Pham  Procedure(s) Performed: Procedure(s): CESAREAN SECTION (N/A)  Patient Location: PACU  Anesthesia Type:Epidural  Level of Consciousness: awake, alert , oriented and patient cooperative  Airway & Oxygen Therapy: Patient Spontanous Breathing  Post-op Assessment: Report given to RN and Post -op Vital signs reviewed and stable  Post vital signs: Reviewed and stable  Last Vitals:  Filed Vitals:   09/18/15 0512 09/18/15 0532  BP:  131/60  Pulse:  87  Temp: 36.8 C   Resp:      Complications: No apparent anesthesia complications

## 2015-09-18 NOTE — Anesthesia Postprocedure Evaluation (Signed)
Anesthesia Post Note  Patient: Jeanette Pham  Procedure(s) Performed: Procedure(s) (LRB): CESAREAN SECTION (N/A)  Patient location during evaluation: PACU Anesthesia Type: Spinal Level of consciousness: oriented and awake and alert Pain management: pain level controlled Vital Signs Assessment: post-procedure vital signs reviewed and stable Respiratory status: spontaneous breathing, respiratory function stable and patient connected to nasal cannula oxygen Cardiovascular status: blood pressure returned to baseline and stable Postop Assessment: no headache and no backache Anesthetic complications: no    Last Vitals:  Filed Vitals:   09/18/15 0935 09/18/15 0947  BP: 123/72 130/61  Pulse: 76 75  Temp:  36.8 C  Resp: 19 21    Last Pain:  Filed Vitals:   09/18/15 0955  PainSc: 0-No pain                 Markavious Micco DANIEL

## 2015-09-18 NOTE — Progress Notes (Signed)
S: comfortable  O: BP 125/68 mmHg  Pulse 98  Temp(Src) 98.6 F (37 C) (Axillary)  Resp 16  Ht 5\' 2"  (1.575 m)  Wt 99.791 kg (220 lb)  BMI 40.23 kg/m2  SpO2 97% VE 8/90/-2 Tracing: baseline 140  (+) early decels [place] of spont ctx q 1 1/2-2 mins  Pt placed in fowler position and had tetanic ctx lasting 5 mins with resultant  fhr down to 60's x 5 mins. [place] terb given  Maternal O2, positional changes( including trendelenburg), maternal stim FHR returns to baseline min variability  IMP: fetal bradycardia due to tetanic ctx dysfunctional uterine pattern P) watch FHR. Maintain current right side

## 2015-09-18 NOTE — Progress Notes (Signed)
S: denies pelvic/rectal pressure  O: BP 130/80 mmHg  Pulse 99  Temp(Src) 98.3 F (36.8 C) (Oral)  Resp 16  Ht 5\' 2"  (1.575 m)  Wt 99.791 kg (220 lb)  BMI 40.23 kg/m2  SpO2 97% Epidural No pitocin VE: asynclitic vtx Right lateral lip not reducible/90/0 station  Tracing: baseline 140 occ early decel some variability Ctx q 2 mins  IMP: arrest of dilation/protracted active phase Term P) reassess @ 6:30 pm. Pt advised if no change, need C/S

## 2015-09-18 NOTE — Progress Notes (Signed)
[facility]  Delivery Note:  C-section       09/18/2015  7:37 AM  I was called to the operating room at the request of the patient's obstetrician Cherly Hensen([place]) for a primary c-section.  PRENATAL HX:  This is a 35 y/o G2P0101 at 6839 and 3/[redacted] weeks gestation who was admitted yesterday for active labor.  Her pregnancy has been uncomplicated.  AROM at 2110 last night (ROM 10.5 hours).  GBS negative.  C-section for failure to descend.    DELIVERY:  Infant was vigorous at delivery, requiring no resuscitation other than standard warming, drying and stimulation.  APGARs 8 and 9.  Exam notable for molding, otherwise within normal limits.  After 5 minutes, baby left with nurse to assist parents with skin-to-skin care.   _____________________ Electronically Signed By: Maryan CharLindsey Renee Erb, MD Neonatologist

## 2015-09-19 ENCOUNTER — Encounter (HOSPITAL_COMMUNITY): Payer: Self-pay | Admitting: *Deleted

## 2015-09-19 LAB — CBC
HEMATOCRIT: 25.2 % — AB (ref 36.0–46.0)
HEMOGLOBIN: 8.6 g/dL — AB (ref 12.0–15.0)
MCH: 30.5 pg (ref 26.0–34.0)
MCHC: 34.1 g/dL (ref 30.0–36.0)
MCV: 89.4 fL (ref 78.0–100.0)
PLATELETS: 219 10*3/uL (ref 150–400)
RBC: 2.82 MIL/uL — AB (ref 3.87–5.11)
RDW: 15 % (ref 11.5–15.5)
WBC: 18.6 10*3/uL — AB (ref 4.0–10.5)

## 2015-09-19 LAB — BIRTH TISSUE RECOVERY COLLECTION (PLACENTA DONATION)

## 2015-09-19 NOTE — Op Note (Signed)
[name], Jeanette Pham           ACCOUNT NO.:  [id number]  MEDICAL RECORD NO.:  [id number]  LOCATION:  9105                          FACILITY:  [facility]  PHYSICIAN:  [name], M.D.DATE OF BIRTH:  January 07, 1981  DATE OF PROCEDURE:  09/18/2015 DATE OF DISCHARGE:                              OPERATIVE REPORT   PREOPERATIVE DIAGNOSIS:  Arrest of dilation, term gestation.  PROCEDURES:  Primary cesarean section, Kerr hysterotomy.  POSTOPERATIVE DIAGNOSIS:  Left occiput transverse presentation, arrest of dilation, term gestation.  ANESTHESIA:  Epidural.  SURGEON:  [name], M.D.  ASSISTANT:  None.  DESCRIPTION OF PROCEDURE:  Under adequate epidural anesthesia, the patient was placed in the supine position with left lateral tilt.  An indwelling Foley catheter was already in place.  The patient was sterilely prepped and draped in usual fashion.  0.25% Marcaine was injected along the planned Pfannenstiel skin incision site. Pfannenstiel skin incision was then made, carried down to the rectus fascia.  The rectus fascia was opened transversely.  The rectus fascia was bluntly and sharply dissected off the rectus muscle in superior and inferior fashion.  The rectus muscle was split in midline.  The parietal peritoneum was entered sharply and extended.  A self-retaining Alexis retractor was then placed.  Vesicouterine peritoneum was opened transversely.  The bladder was displaced inferiorly with blunt dissection.  A curvilinear low transverse uterine incision was made and extended with bandage scissors, at which point, the baby's right shoulder presented in the field with the loop of cord, this was then reduced into the uterine cavity whereby subsequent delivery of a live female from the left occiput transverse position was accomplished.  Cord around the neck x1 was reducible.  The cord was clamped, cut, and the baby was transferred to the awaiting pediatricians, who assigned  Apgars of 8 and 9 at 1 and 5 minutes.  The placenta was spontaneous, intact, not sent to Pathology.  Uterine cavity was cleaned of debris.  Uterine incision had an extension on the right inferior aspect. The extension was closed in a separate layer x2 with 0 Monocryl suture.  The remaining incision was closed with 0 Monocryl running locked stitch, second layer was imbricated using 0 Monocryl suture.  Normal tubes and ovaries were noted bilaterally.  Abdomen was irrigated and suctioned of debris. Interceed was placed overlying the lower uterine segment in an inverted T fashion.  The Alexis retractor was removed.  The parietal peritoneum was closed with 2-0 Vicryl.  Rectus fascia was closed with 0 Vicryl x2. The subcutaneous area was irrigated, small bleeders cauterized. Interrupted 2-0 plain sutures placed and the skin approximated with Ethicon staples.  SPECIMENS:  Placenta not sent to Pathology.  ESTIMATED BLOOD LOSS:  800 mL.  INTRAOPERATIVE FLUID:  1500 mL.  URINE OUTPUT:  100 mL.  Sponge and instrument counts x2 were correct.  COMPLICATION:  None.  The patient tolerated the procedure well and was transferred to recovery room in stable condition.     [name], M.D.     [facility]/MEDQ  D:  09/18/2015  T:  09/19/2015  Job:  [id number]

## 2015-09-19 NOTE — Progress Notes (Signed)
Patient ID: Jeanette Pham, female   DOB: [birth year], [age] y.o.   MRN: [id number] Subjective: S/P Cesarean Delivery for Arrest of Dilation POD# 1 Information for the patient's newborn:  [place], Girl Toni AmendCourtney [[id number]][[id number]]  female   Reports feeling well. Feeding: bottle Patient reports tolerating PO.  Breast symptoms: none Pain controlled with ibuprofen (OTC) Denies HA/SOB/C/P/N/V/dizziness. Flatus absent. No BM. She reports vaginal bleeding as normal, without clots.  She is ambulating, urinating without difficult.     Objective:   VS:  Filed Vitals:   09/18/15 1701 09/18/15 2219 09/19/15 0244 09/19/15 0633  BP: 119/61 111/67 103/55 108/53  Pulse: 63 65 67 79  Temp: 98.4 F (36.9 C) 98.7 F (37.1 C) 97.9 F (36.6 C) 98.2 F (36.8 C)  TempSrc: Oral Oral Oral Oral  Resp: 20 18 18 18   Height:      Weight:      SpO2: 97% 96% 95% 97%     Intake/Output Summary (Last 24 hours) at 09/19/15 0943 Last data filed at 09/19/15 0600  Gross per 24 hour  Intake   2000 ml  Output   3650 ml  Net  -1650 ml        Recent Labs  09/17/15 1950 09/19/15 0459  WBC 13.7* 18.6*  HGB 11.7* 8.6*  HCT 33.6* 25.2*  PLT 256 219     Blood type: O POS (04/02 1950)  Rubella: Immune (09/07 0000)     Physical Exam:   General: alert, cooperative, no distress and moderately obese  CV: Regular rate and rhythm, S1S2 present or without murmur or extra heart sounds  Resp: clear  Abdomen: soft, nontender, normal bowel sounds  Incision: dry, intact, dried serous drainage present in middle of Honeycomb dressing; skin well-approximated with staples  Uterine Fundus: firm, 1 FB below umbilicus, nontender  Lochia: minimal  Ext: extremities normal, atraumatic, no cyanosis or edema, Homans sign is negative, no sign of DVT and no edema, redness or tenderness in the calves or thighs   Assessment/Plan: 35 y.o.   POD# 1.  S/P Cesarean Delivery.  Indications: arrest of dilation                Principal  Problem:   Postpartum care following cesarean delivery (4/3) Active Problems:   Active labor  Doing well, stable.               Regular diet as tolerated Ambulate in hallway TID today Routine post-op care Anticipate D/C home tomorrow   Raelyn MoraAWSON, Yaziel Brandon, M, MSN, CNM 09/19/2015, 9:43 AM

## 2015-09-20 MED ORDER — FERROUS SULFATE 325 (65 FE) MG PO TABS: 325.0000 mg | ORAL_TABLET | Freq: Two times a day (BID) | ORAL | Status: AC

## 2015-09-20 MED ORDER — IBUPROFEN 600 MG PO TABS: 600.0000 mg | ORAL_TABLET | Freq: Four times a day (QID) | ORAL | Status: AC

## 2015-09-20 MED ORDER — MAGNESIUM OXIDE 400 (241.3 MG) MG PO TABS: 400.0000 mg | ORAL_TABLET | Freq: Every day | ORAL | Status: AC

## 2015-09-20 MED ORDER — OXYCODONE HCL 10 MG PO TABS: 10.0000 mg | ORAL_TABLET | ORAL | Status: AC | PRN

## 2015-09-20 MED ORDER — MAGNESIUM OXIDE 400 (241.3 MG) MG PO TABS
400.0000 mg | ORAL_TABLET | Freq: Every day | ORAL | Status: DC
  Administered 2015-09-20: 400 mg via ORAL
  Filled 2015-09-20: qty 1

## 2015-09-20 NOTE — Progress Notes (Addendum)
POSTOPERATIVE DAY # 2 S/P CS   S:         Reports feeling well & desires DC today             Tolerating po intake / no nausea / no vomiting / + flatus / no BM             Bleeding is light             Pain controlled with motrin             Up ad lib / ambulatory/ voiding QS  Newborn breast feeding   O:  VS: BP 136/79 mmHg  Pulse 92  Temp(Src) 97.7 F (36.5 C) (Oral)  Resp 16  Ht 5\' 2"  (1.575 m)  Wt 99.791 kg (220 lb)  BMI 40.23 kg/m2  SpO2 97%  Breastfeeding? Unknown   LABS:               Recent Labs  09/17/15 1950 09/19/15 0459  WBC 13.7* 18.6*  HGB 11.7* 8.6*  PLT 256 219               Bloodtype: --/--/O POS, O POS (04/02 1950)  Rubella: Immune (09/07 0000)                                 Physical Exam:             Alert and Oriented X3  Lungs: Clear and unlabored  Heart: regular rate and rhythm / no mumurs  Abdomen: soft, non-tender, non-distended             Fundus: firm, non-tender, U-1             Dressing intact honeycomb              Incision:  approximated with staple / no erythema / no ecchymosis / dried drainage  Perineum: no edema  Lochia: light  Extremities: 1+ pedal edema, no calf pain or tenderness, neg Homans  A:        POD # 2 CS            ABl anemia  P:        Routine postoperative care              DC home - [facility] booklet - instructions reviewed     Jeanette Pham, Jeanette Pham CNM, MSN, FACNM 09/20/2015, 8:02 AM

## 2015-09-20 NOTE — Discharge Summary (Signed)
POSTOPERATIVE DISCHARGE SUMMARY:  Patient ID: Lenox PondsCourtney B Salaam MRN: [id number] DOB/AGE: Aug 14, 1980 35 y.o.  Admit date: 09/17/2015 Admission Diagnoses: 39.3 weeks active labor  Discharge date:  09/20/2015 Discharge Diagnoses: POD 3 s/p cesarean section for arrest of active labor  Prenatal history: G2P1101   EDC : 09/22/2015, Alternate EDD Entry  [facility]  Primary provider : Dr Ernestina PennaFogleman Prenatal course uncomplicated  Prenatal Labs: ABO, Rh: --/--/O POS, O POS (04/02 1950) Antibody: NEG (04/02 1950) Rubella: Immune (09/07 0000)  RPR: Non Reactive (04/02 1950)  HBsAg: Negative (09/07 0000)  HIV: Non-reactive (09/07 0000)  GBS: Negative (03/09 0000)   Medical / Surgical History :  Past medical history:  Past Medical History  Diagnosis Date  . Medical history non-contributory   . Postpartum care following cesarean delivery (4/3) 09/18/2015    Past surgical history:  Past Surgical History  Procedure Laterality Date  . Wrist surgery    . Wrist fracture surgery    . Cesarean section N/A 09/18/2015    Procedure: CESAREAN SECTION;  Surgeon: Maxie BetterSheronette Makell Cyr, MD;  Location: [facility];  Service: Obstetrics;  Laterality: N/A;    Family History:  Family History  Problem Relation Age of Onset  . Hypertension Mother   . Diabetes Maternal Grandmother     Social History:  reports that she quit smoking about a year ago. She does not have any smokeless tobacco history on file. She reports that she drinks alcohol. She reports that she does not use illicit drugs.  Allergies: Review of patient's allergies indicates no known allergies.   Current Medications at time of admission:  Prior to Admission medications   Medication Sig Start Date End Date Taking? Authorizing Provider  guaiFENesin (MUCINEX) 600 MG 12 hr tablet Take 1,200 mg by mouth 2 (two) times daily.   Yes Historical Provider, MD  Prenatal Vit-Min-FA-Fish Oil ([employer] PRENATAL GUMMY) 0.4-113.5  MG CHEW Chew 2 capsules by mouth daily.   Yes Historical Provider, MD    Intrapartum Course:  Admit for active labor with labor progression to 9cm (anterio rim) dilation with normal labor curve Pain management: epidural Complicated by: arrest of dilation  Interventions required: cesarean section  Procedures: Cesarean section delivery on 09/20/2015 with delivery of  Viable female newborn by Dr Cherly Hensenousins   See operative report for further details APGAR (1 MIN): 8   APGAR (5 MINS): 9    Postoperative / postpartum course:  Uncomplicated with discharge on POD 3   Discharge Instructions:  Discharged Condition: stable  Activity: pelvic rest and postoperative restrictions x 2   Diet: routine  Medications:    Medication List    TAKE these medications        [employer] PRENATAL GUMMY 0.4-113.5 MG Chew  Chew 2 capsules by mouth daily.     ferrous sulfate 325 (65 FE) MG tablet  Take 1 tablet (325 mg total) by mouth 2 (two) times daily with a meal.     guaiFENesin 600 MG 12 hr tablet  Commonly known as:  MUCINEX  Take 1,200 mg by mouth 2 (two) times daily.     ibuprofen 600 MG tablet  Commonly known as:  ADVIL,MOTRIN  Take 1 tablet (600 mg total) by mouth every 6 (six) hours.     magnesium oxide 400 (241.3 Mg) MG tablet  Commonly known as:  MAG-OX  Take 1 tablet (400 mg total) by mouth daily.     Oxycodone HCl 10 MG Tabs  Take 1  tablet (10 mg total) by mouth every 4 (four) hours as needed (pain scale > 7).        Wound Care: keep clean and dry / remove honeycomb POD 5 with staple removal at [facility] Postpartum Instructions: [facility] discharge booklet - instructions reviewed  Discharge to: Home  Follow up :  [facility] in 2 days for interval visit with nurse at for staple remval [facility] in 6 weeks for routine postpartum visit with DR Ernestina Penna                Signed: Marlinda Mike CNM, MSN, FACNM 09/20/2015, 8:08 AM

## 2016-06-28 ENCOUNTER — Other Ambulatory Visit: Payer: Self-pay | Admitting: Family Medicine

## 2016-06-28 ENCOUNTER — Ambulatory Visit
Admission: RE | Admit: 2016-06-28 | Discharge: 2016-06-28 | Disposition: A | Discharge status: Home / Self Care | Payer: 59 | Source: Ambulatory Visit | Attending: Family Medicine | Admitting: Family Medicine

## 2016-06-28 DIAGNOSIS — R52 Pain, unspecified: Secondary | ICD-10-CM

## 2019-01-25 ENCOUNTER — Other Ambulatory Visit: Payer: Self-pay

## 2019-01-25 ENCOUNTER — Telehealth (INDEPENDENT_AMBULATORY_CARE_PROVIDER_SITE_OTHER): Payer: 59 | Admitting: Primary Care
